# Patient Record
Sex: Female | Born: 1955 | Race: White | Hispanic: No | Marital: Married | Smoking: Former smoker
Health system: Southern US, Community
[De-identification: ages and names within clinical notes are randomized; demographics above are authoritative.]

## PROBLEM LIST (undated history)

## (undated) DIAGNOSIS — N6019 Diffuse cystic mastopathy of unspecified breast: Secondary | ICD-10-CM

## (undated) DIAGNOSIS — K219 Gastro-esophageal reflux disease without esophagitis: Secondary | ICD-10-CM

## (undated) DIAGNOSIS — C4491 Basal cell carcinoma of skin, unspecified: Secondary | ICD-10-CM

## (undated) DIAGNOSIS — R7303 Prediabetes: Secondary | ICD-10-CM

## (undated) HISTORY — PX: BREAST BIOPSY: SHX20

## (undated) HISTORY — PX: SKIN CANCER EXCISION: SHX779

## (undated) HISTORY — PX: TONSILLECTOMY: SUR1361

---

## 2007-01-18 ENCOUNTER — Ambulatory Visit (HOSPITAL_COMMUNITY): Admission: RE | Admit: 2007-01-18 | Discharge: 2007-01-18 | Payer: Self-pay | Admitting: Emergency Medicine

## 2007-11-11 ENCOUNTER — Encounter: Admission: RE | Admit: 2007-11-11 | Discharge: 2007-11-11 | Payer: Self-pay | Admitting: Family Medicine

## 2007-11-22 ENCOUNTER — Encounter: Admission: RE | Admit: 2007-11-22 | Discharge: 2007-11-22 | Payer: Self-pay | Admitting: Family Medicine

## 2008-02-15 ENCOUNTER — Other Ambulatory Visit: Admission: RE | Admit: 2008-02-15 | Discharge: 2008-02-15 | Payer: Self-pay | Admitting: Family Medicine

## 2008-12-19 ENCOUNTER — Encounter: Admission: RE | Admit: 2008-12-19 | Discharge: 2008-12-19 | Payer: Self-pay | Admitting: Family Medicine

## 2009-06-18 ENCOUNTER — Other Ambulatory Visit: Admission: RE | Admit: 2009-06-18 | Discharge: 2009-06-18 | Payer: Self-pay | Admitting: Family Medicine

## 2010-06-20 ENCOUNTER — Other Ambulatory Visit: Admission: RE | Admit: 2010-06-20 | Discharge: 2010-06-20 | Payer: Self-pay | Admitting: Family Medicine

## 2010-09-22 ENCOUNTER — Encounter: Payer: Self-pay | Admitting: Family Medicine

## 2011-01-28 ENCOUNTER — Other Ambulatory Visit: Payer: Self-pay | Admitting: Dermatology

## 2011-12-15 ENCOUNTER — Other Ambulatory Visit: Payer: Self-pay | Admitting: Family Medicine

## 2011-12-15 DIAGNOSIS — Z1231 Encounter for screening mammogram for malignant neoplasm of breast: Secondary | ICD-10-CM

## 2011-12-18 ENCOUNTER — Ambulatory Visit
Admission: RE | Admit: 2011-12-18 | Discharge: 2011-12-18 | Disposition: A | Discharge status: Home / Self Care | Payer: Private Health Insurance - Indemnity | Source: Ambulatory Visit | Attending: Family Medicine | Admitting: Family Medicine

## 2011-12-18 DIAGNOSIS — Z1231 Encounter for screening mammogram for malignant neoplasm of breast: Secondary | ICD-10-CM

## 2012-08-13 ENCOUNTER — Other Ambulatory Visit: Payer: Self-pay

## 2012-11-24 ENCOUNTER — Other Ambulatory Visit: Payer: Self-pay

## 2012-11-24 DIAGNOSIS — Z1231 Encounter for screening mammogram for malignant neoplasm of breast: Secondary | ICD-10-CM

## 2012-12-21 ENCOUNTER — Ambulatory Visit: Payer: Private Health Insurance - Indemnity

## 2012-12-30 ENCOUNTER — Ambulatory Visit
Admission: RE | Admit: 2012-12-30 | Discharge: 2012-12-30 | Disposition: A | Discharge status: Home / Self Care | Payer: Private Health Insurance - Indemnity | Source: Ambulatory Visit

## 2012-12-30 DIAGNOSIS — Z1231 Encounter for screening mammogram for malignant neoplasm of breast: Secondary | ICD-10-CM

## 2013-02-21 ENCOUNTER — Emergency Department (HOSPITAL_COMMUNITY): Payer: Private Health Insurance - Indemnity

## 2013-02-21 ENCOUNTER — Emergency Department (HOSPITAL_COMMUNITY)
Admission: EM | Admit: 2013-02-21 | Discharge: 2013-02-22 | Disposition: A | Discharge status: Home / Self Care | Payer: Private Health Insurance - Indemnity | Attending: Emergency Medicine | Admitting: Emergency Medicine

## 2013-02-21 ENCOUNTER — Encounter (HOSPITAL_COMMUNITY): Payer: Self-pay | Admitting: Vascular Surgery

## 2013-02-21 DIAGNOSIS — R109 Unspecified abdominal pain: Secondary | ICD-10-CM

## 2013-02-21 DIAGNOSIS — Z862 Personal history of diseases of the blood and blood-forming organs and certain disorders involving the immune mechanism: Secondary | ICD-10-CM | POA: Insufficient documentation

## 2013-02-21 DIAGNOSIS — K219 Gastro-esophageal reflux disease without esophagitis: Secondary | ICD-10-CM | POA: Insufficient documentation

## 2013-02-21 DIAGNOSIS — R0789 Other chest pain: Secondary | ICD-10-CM | POA: Insufficient documentation

## 2013-02-21 DIAGNOSIS — R209 Unspecified disturbances of skin sensation: Secondary | ICD-10-CM | POA: Insufficient documentation

## 2013-02-21 DIAGNOSIS — M549 Dorsalgia, unspecified: Secondary | ICD-10-CM | POA: Insufficient documentation

## 2013-02-21 DIAGNOSIS — Z79899 Other long term (current) drug therapy: Secondary | ICD-10-CM | POA: Insufficient documentation

## 2013-02-21 DIAGNOSIS — Z8742 Personal history of other diseases of the female genital tract: Secondary | ICD-10-CM | POA: Insufficient documentation

## 2013-02-21 DIAGNOSIS — Z85828 Personal history of other malignant neoplasm of skin: Secondary | ICD-10-CM | POA: Insufficient documentation

## 2013-02-21 DIAGNOSIS — R110 Nausea: Secondary | ICD-10-CM | POA: Insufficient documentation

## 2013-02-21 DIAGNOSIS — Z8639 Personal history of other endocrine, nutritional and metabolic disease: Secondary | ICD-10-CM | POA: Insufficient documentation

## 2013-02-21 DIAGNOSIS — Z87891 Personal history of nicotine dependence: Secondary | ICD-10-CM | POA: Insufficient documentation

## 2013-02-21 DIAGNOSIS — R1013 Epigastric pain: Secondary | ICD-10-CM | POA: Insufficient documentation

## 2013-02-21 DIAGNOSIS — R000 Tachycardia, unspecified: Secondary | ICD-10-CM | POA: Insufficient documentation

## 2013-02-21 DIAGNOSIS — R079 Chest pain, unspecified: Secondary | ICD-10-CM

## 2013-02-21 DIAGNOSIS — R0602 Shortness of breath: Secondary | ICD-10-CM | POA: Insufficient documentation

## 2013-02-21 HISTORY — DX: Prediabetes: R73.03

## 2013-02-21 HISTORY — DX: Basal cell carcinoma of skin, unspecified: C44.91

## 2013-02-21 HISTORY — DX: Gastro-esophageal reflux disease without esophagitis: K21.9

## 2013-02-21 HISTORY — DX: Diffuse cystic mastopathy of unspecified breast: N60.19

## 2013-02-21 LAB — POCT I-STAT TROPONIN I: Troponin i, poc: 0.01 ng/mL (ref 0.00–0.08)

## 2013-02-21 LAB — CBC WITH DIFFERENTIAL/PLATELET
Basophils Absolute: 0 10*3/uL (ref 0.0–0.1)
Basophils Relative: 0 % (ref 0–1)
Eosinophils Relative: 1 % (ref 0–5)
HCT: 43.6 % (ref 36.0–46.0)
Hemoglobin: 15.2 g/dL — ABNORMAL HIGH (ref 12.0–15.0)
MCH: 30.4 pg (ref 26.0–34.0)
MCHC: 34.9 g/dL (ref 30.0–36.0)
MCV: 87.2 fL (ref 78.0–100.0)
Monocytes Absolute: 0.6 10*3/uL (ref 0.1–1.0)
Monocytes Relative: 4 % (ref 3–12)
Neutro Abs: 12.6 10*3/uL — ABNORMAL HIGH (ref 1.7–7.7)
RDW: 13.4 % (ref 11.5–15.5)

## 2013-02-21 LAB — COMPREHENSIVE METABOLIC PANEL
AST: 13 U/L (ref 0–37)
Albumin: 4.1 g/dL (ref 3.5–5.2)
BUN: 13 mg/dL (ref 6–23)
Calcium: 9.3 mg/dL (ref 8.4–10.5)
Chloride: 102 mEq/L (ref 96–112)
Creatinine, Ser: 0.63 mg/dL (ref 0.50–1.10)
GFR calc non Af Amer: >90 mL/min (ref >90)
Total Bilirubin: 0.6 mg/dL (ref 0.3–1.2)

## 2013-02-21 LAB — LIPASE, BLOOD: Lipase: 20 U/L (ref 11–59)

## 2013-02-21 MED ORDER — METOCLOPRAMIDE HCL 5 MG/ML IJ SOLN
10.0000 mg | Freq: Once | INTRAMUSCULAR | Status: AC
  Administered 2013-02-21: 10 mg via INTRAVENOUS
  Filled 2013-02-21: qty 2

## 2013-02-21 MED ORDER — IOHEXOL 350 MG/ML SOLN: 100.0000 mL | Freq: Once | INTRAVENOUS | Status: AC | PRN

## 2013-02-21 NOTE — ED Provider Notes (Signed)
History    CSN: [id number] Arrival date & time 02/21/13  2039  First MD Initiated Contact with Patient 02/21/13 2138     Chief Complaint  Patient presents with  . Chest Pain   (Consider location/radiation/quality/duration/timing/severity/associated sxs/prior Treatment) Patient is a 57 y.o. female presenting with chest pain. The history is provided by the patient.  Chest Pain Pain location:  Substernal area Pain quality: pressure   Pain radiates to:  Does not radiate Pain radiates to the back: no   Pain severity:  Moderate Onset quality:  Sudden Timing:  Intermittent Progression:  Unchanged Chronicity:  New Context: at rest   Relieved by:  Nothing Worsened by:  Certain positions and deep breathing Ineffective treatments:  None tried Associated symptoms: abdominal pain (prior to had onset of abd pain described at "tightness" that has been intermittent, located over mid abdomen), back pain (sharp, mid back pain), nausea and shortness of breath   Associated symptoms: no cough, no diaphoresis, no dizziness, no fever, no numbness, not vomiting and no weakness    Past Medical History  Diagnosis Date  . Prediabetes   . GERD (gastroesophageal reflux disease)   . Skin cancer, basal cell   . Fibrocystic breast disease in female    Past Surgical History  Procedure Laterality Date  . Tonsillectomy    . Skin cancer excision    . Breast biopsy     No family history on file. History  Substance Use Topics  . Smoking status: Former Games developer  . Smokeless tobacco: Not on file  . Alcohol Use: Yes     Comment: occasionally    OB History   Grav Para Term Preterm Abortions TAB SAB Ect Mult Living                 Review of Systems  Constitutional: Negative for fever, chills and diaphoresis.  HENT: Negative for congestion and rhinorrhea.   Respiratory: Positive for shortness of breath. Negative for cough and chest tightness.   Cardiovascular: Positive for chest pain. Negative for  leg swelling.  Gastrointestinal: Positive for nausea and abdominal pain (prior to had onset of abd pain described at "tightness" that has been intermittent, located over mid abdomen). Negative for vomiting and diarrhea.  Genitourinary: Negative for dysuria.  Musculoskeletal: Positive for back pain (sharp, mid back pain).  Neurological: Negative for dizziness, weakness and numbness.       Tingling of right hand and arm.   All other systems reviewed and are negative.    Allergies  Review of patient's allergies indicates no known allergies.  Home Medications   Current Outpatient Rx  Name  Route  Sig  Dispense  Refill  . ergocalciferol (VITAMIN D2) 50000 UNITS capsule   Oral   Take 50,000 Units by mouth once a week. Take on Sundays         . famotidine (PEPCID) 20 MG tablet   Oral   Take 20 mg by mouth daily as needed for heartburn.          BP 125/91  Pulse 106  Temp(Src) 98.5 F (36.9 C) (Oral)  Resp 18  SpO2 98% Physical Exam  Nursing note and vitals reviewed. Constitutional: She is oriented to person, place, and time. She appears well-developed and well-nourished. No distress.  HENT:  Head: Normocephalic and atraumatic.  Mouth/Throat: Oropharynx is clear and moist.  Eyes: EOM are normal. Pupils are equal, round, and reactive to light.  Neck: Normal range of motion. Neck supple.  Cardiovascular: Normal rate, regular rhythm and normal heart sounds.  Exam reveals no friction rub.   No murmur heard. Pulmonary/Chest: Effort normal and breath sounds normal. No respiratory distress. She has no wheezes. She has no rales.  Abdominal: Soft. There is no tenderness. There is no rebound and no guarding.  Musculoskeletal: Normal range of motion. She exhibits no edema and no tenderness.  Lymphadenopathy:    She has no cervical adenopathy.  Neurological: She is alert and oriented to person, place, and time.  Skin: Skin is warm and dry. No rash noted.  Psychiatric: She has a  normal mood and affect. Her behavior is normal.    ED Course  Procedures (including critical care time) Labs Reviewed  CBC WITH DIFFERENTIAL - Abnormal; Notable for the following:    WBC 14.0 (*)    Hemoglobin 15.2 (*)    Neutrophils Relative % 90 (*)    Neutro Abs 12.6 (*)    Lymphocytes Relative 6 (*)    All other components within normal limits  COMPREHENSIVE METABOLIC PANEL - Abnormal; Notable for the following:    Glucose, Bld 123 (*)    All other components within normal limits  LIPASE, BLOOD  POCT I-STAT TROPONIN I   Dg Chest 2 View  02/21/2013   *RADIOLOGY REPORT*  Clinical Data: Chest pain, epigastric pain.  CHEST - 2 VIEW  Comparison: None.  Findings: Heart and mediastinal contours are within normal limits. No focal opacities or effusions.  No acute bony abnormality. Degenerative spurring anteriorly within the lower thoracic spine.  IMPRESSION: No active cardiopulmonary disease.   Original Report Authenticated By: Charlett Nose, M.D.   Ct Angio Chest Aortic Dissect W &/or W/o  02/22/2013   *RADIOLOGY REPORT*  Clinical Data:  Midsternal pain radiating to back.  Shortness of breath, abdominal pain.  CT ANGIOGRAPHY CHEST, ABDOMEN AND PELVIS  Technique:  Multidetector CT imaging through the chest, abdomen and pelvis was performed using the standard protocol during bolus administration of intravenous contrast.  Multiplanar reconstructed images including MIPs were obtained and reviewed to evaluate the vascular anatomy.  Contrast: OMNIPAQUE IOHEXOL 350 MG/ML SOLN  Comparison:   None.  CTA CHEST  Findings:  Heart is normal size. Aorta is normal caliber.  No evidence of aortic dissection.  No visible coronary artery calcifications. No mediastinal, hilar, or axillary adenopathy. Visualized thyroid and chest wall soft tissues unremarkable.  Small hiatal hernia.  Dependent atelectasis in the lungs.  Otherwise no focal opacities. No pleural effusions.  No acute bony abnormality.  While not  scanned optimally opacified pulmonary arteries, no filling defects are visualized to suggest large or significant pulmonary emboli.   Review of the MIP images confirms the above findings.  IMPRESSION: No evidence of aortic aneurysm or dissection.  No acute bony abnormality.  CTA ABDOMEN AND PELVIS  Findings:  Mild diffuse fatty infiltration of the liver.  No focal lesion.  Gallbladder, spleen, pancreas, adrenals and kidneys are normal.  Aorta and iliac vessels are normal caliber.  No aneurysm or dissection.  Mesenteric vessels and renal arteries are widely patent.  There is a small accessory superior right renal artery.  Small scattered retroperitoneal lymph nodes and mesenteric lymph nodes, none pathologically enlarged.  Uterus, adnexa and urinary bladder are unremarkable.  No acute bony abnormality. Appendix is visualized and is normal.   Review of the MIP images confirms the above findings.  IMPRESSION:  No evidence of aortic aneurysm or dissection.  No acute findings in the abdomen  or pelvis.   Original Report Authenticated By: Charlett Nose, M.D.   Ct Angio Abd/pel W/ And/or W/o  02/22/2013   *RADIOLOGY REPORT*  Clinical Data:  Midsternal pain radiating to back.  Shortness of breath, abdominal pain.  CT ANGIOGRAPHY CHEST, ABDOMEN AND PELVIS  Technique:  Multidetector CT imaging through the chest, abdomen and pelvis was performed using the standard protocol during bolus administration of intravenous contrast.  Multiplanar reconstructed images including MIPs were obtained and reviewed to evaluate the vascular anatomy.  Contrast: OMNIPAQUE IOHEXOL 350 MG/ML SOLN  Comparison:   None.  CTA CHEST  Findings:  Heart is normal size. Aorta is normal caliber.  No evidence of aortic dissection.  No visible coronary artery calcifications. No mediastinal, hilar, or axillary adenopathy. Visualized thyroid and chest wall soft tissues unremarkable.  Small hiatal hernia.  Dependent atelectasis in the lungs.  Otherwise no  focal opacities. No pleural effusions.  No acute bony abnormality.  While not scanned optimally opacified pulmonary arteries, no filling defects are visualized to suggest large or significant pulmonary emboli.   Review of the MIP images confirms the above findings.  IMPRESSION: No evidence of aortic aneurysm or dissection.  No acute bony abnormality.  CTA ABDOMEN AND PELVIS  Findings:  Mild diffuse fatty infiltration of the liver.  No focal lesion.  Gallbladder, spleen, pancreas, adrenals and kidneys are normal.  Aorta and iliac vessels are normal caliber.  No aneurysm or dissection.  Mesenteric vessels and renal arteries are widely patent.  There is a small accessory superior right renal artery.  Small scattered retroperitoneal lymph nodes and mesenteric lymph nodes, none pathologically enlarged.  Uterus, adnexa and urinary bladder are unremarkable.  No acute bony abnormality. Appendix is visualized and is normal.   Review of the MIP images confirms the above findings.  IMPRESSION:  No evidence of aortic aneurysm or dissection.  No acute findings in the abdomen or pelvis.   Original Report Authenticated By: Charlett Nose, M.D.   Date: 02/22/2013  Rate: 95  Rhythm: normal sinus rhythm  QRS Axis: left  Intervals: normal  ST/T Wave abnormalities: normal  Conduction Disutrbances:none  Narrative Interpretation: borderline R wave progression  Old EKG Reviewed: none available   1. Abdominal pain   2. Chest pain   3. Back pain   4. Nausea     MDM  37:14 PM 57 year old female with no major medical problems presenting with sudden onset abdominal pain described as a tightness with later onset of chest pressure and a sharp mid back back pain. It symptoms were associated with shortness of breath and nausea and have been intermittent. Patient is noted to be tachycardic in triage, heart rate in the mid to upper 90s on my exam. She does state she has intermittent tingling of her right arm during these episodes.  Given tachycardia, chest, abdominal and back pain along with tingling in her right hand in a woman with no similar symptoms in the past Will check labs the troponin and CT thoracic dissection protocol.  1:17 AM CT dissection study neg. Nausea improved with reglan. Likely GI source. Story not c/w ACS. No PE seen. She will f/u with PCP for re-eval. Pt understanding of plan and dc'd home in stable condition.  Caren Hazy, MD 02/22/13 [phone]

## 2013-02-21 NOTE — ED Notes (Signed)
Pt reports to the ED for eval of midsternal CP that radiates into her back. Pt was driving home today and began experiencing generalized abdominal pain. Pt got home and when she was lying down she began having severe CP that radiated into her back. Pt also reports SOB, N/V, and abdominal pain. Pt has had 8 mg of Zofran, 324 of ASA, and 1 nitro PTA. Pt refused 2nd dose of nitro due to HA. Pts 12 lead was unremarkable. V/S stable en route. Pt A&O x 4. Pt can speak in full sentences without difficulty. Pt denies any coffee ground or gross blood in her emesis. Pt was sinus tach upon arrival however, she is in NSR at this time. Lying flat aggravates the abdominal pain.

## 2013-02-22 ENCOUNTER — Emergency Department (HOSPITAL_COMMUNITY): Payer: Private Health Insurance - Indemnity

## 2013-02-22 MED ORDER — METOCLOPRAMIDE HCL 10 MG PO TABS: 10.0000 mg | ORAL_TABLET | Freq: Four times a day (QID) | ORAL | Status: DC

## 2013-02-22 MED ORDER — IOHEXOL 350 MG/ML SOLN
100.0000 mL | Freq: Once | INTRAVENOUS | Status: AC | PRN
  Administered 2013-02-22: 100 mL via INTRAVENOUS

## 2013-02-22 NOTE — ED Provider Notes (Signed)
57 year old female developed abdominal pain today which she eventually radiated up to the chest and then into the right side of her back. There has been associated dyspnea and nausea. She has not vomited. On exam, she has epigastric tenderness but no other abdominal tenderness. Lungs are clear and heart has a regular rate and rhythm. Workup has been initiated including CT angiogram to rule out dissecting aneurysm.   Date: 02/22/2013  Rate: 95  Rhythm: normal sinus rhythm  QRS Axis: left  Intervals: normal  ST/T Wave abnormalities: normal  Conduction Disutrbances:left anterior fascicular block  Narrative Interpretation: Left anterior fascicular block, left atrial hypertrophy. No prior ECG available for comparison.  Old EKG Reviewed: none available    I saw and evaluated the patient, reviewed the resident's note and I agree with the findings and plan.  Dione Booze, MD 02/22/13 0030

## 2013-06-08 ENCOUNTER — Other Ambulatory Visit (HOSPITAL_COMMUNITY)
Admission: RE | Admit: 2013-06-08 | Discharge: 2013-06-08 | Disposition: A | Discharge status: Home / Self Care | Payer: Private Health Insurance - Indemnity | Source: Ambulatory Visit | Attending: Family Medicine | Admitting: Family Medicine

## 2013-06-08 ENCOUNTER — Other Ambulatory Visit: Payer: Self-pay | Admitting: Family Medicine

## 2013-06-08 DIAGNOSIS — Z124 Encounter for screening for malignant neoplasm of cervix: Secondary | ICD-10-CM | POA: Insufficient documentation

## 2014-05-25 ENCOUNTER — Other Ambulatory Visit: Payer: Self-pay

## 2014-05-25 DIAGNOSIS — Z1231 Encounter for screening mammogram for malignant neoplasm of breast: Secondary | ICD-10-CM

## 2014-06-12 ENCOUNTER — Ambulatory Visit
Admission: RE | Admit: 2014-06-12 | Discharge: 2014-06-12 | Disposition: A | Discharge status: Home / Self Care | Payer: Managed Care, Other (non HMO) | Source: Ambulatory Visit

## 2014-06-12 DIAGNOSIS — Z1231 Encounter for screening mammogram for malignant neoplasm of breast: Secondary | ICD-10-CM

## 2015-02-14 ENCOUNTER — Ambulatory Visit
Admission: RE | Admit: 2015-02-14 | Discharge: 2015-02-14 | Disposition: A | Discharge status: Home / Self Care | Payer: Managed Care, Other (non HMO) | Source: Ambulatory Visit | Attending: Family Medicine | Admitting: Family Medicine

## 2015-02-14 ENCOUNTER — Other Ambulatory Visit: Payer: Self-pay | Admitting: Family Medicine

## 2015-02-14 DIAGNOSIS — W19XXXA Unspecified fall, initial encounter: Secondary | ICD-10-CM

## 2015-03-22 ENCOUNTER — Ambulatory Visit (INDEPENDENT_AMBULATORY_CARE_PROVIDER_SITE_OTHER): Payer: Managed Care, Other (non HMO) | Admitting: Podiatry

## 2015-03-22 ENCOUNTER — Encounter: Payer: Self-pay | Admitting: Podiatry

## 2015-03-22 VITALS — BP 127/77 | HR 85 | Resp 16 | Ht 61.0 in | Wt 165.0 lb

## 2015-03-22 DIAGNOSIS — L600 Ingrowing nail: Secondary | ICD-10-CM

## 2015-03-22 MED ORDER — NEOMYCIN-POLYMYXIN-HC 3.5-10000-1 OT SOLN: OTIC | Status: AC

## 2015-03-22 NOTE — Patient Instructions (Signed)

## 2015-03-22 NOTE — Progress Notes (Signed)
   Subjective:    Patient ID: Monica Norris, female    DOB: 08-25-1956, 59 y.o.   MRN: [id number]  HPI Comments: "I have an ingrown toenail"  Patient c/o aching 1st toe right, lateral border, for about 1 week. She went to have a pedicure and they trimmed it out some. Red and swollen. She has been trimming some and soaking.  Diabetic - last A1c was 6.1  Toe Pain       Review of Systems  Musculoskeletal: Positive for arthralgias.  Skin: Positive for rash.  Allergic/Immunologic: Positive for environmental allergies.  All other systems reviewed and are negative.      Objective:   Physical Exam  I have reviewed his past medical history medications allergy surgery social history and review of systems. Pulses are strongly palpable. Neurologic sensorium is intact per Semmes-Weinstein monofilament. Deep tendon reflexes are intact bilateral muscle strength +5 over 5 dorsiflexion plantar flexors and inverters everters all intrinsic musculature is intact. Orthopedic evaluation of his traits all joints distal to the ankle range of motion without crepitation. Cutaneous evaluation demonstrates supple well-hydrated cutis sharply incurvated nail margins.         Assessment & Plan:  Assessment: Ingrown nail hallux right  Plan: Chemical matrixectomy was performed today after local anesthesia was achieved. Patient tolerated procedure well as the nail was split from distal to proximal avulsed margins exposed the matrix of the nailbed to the phenol. Phenol was applied 30 seconds each 3 and was neutralized with isopropyl all all. Patient will start soaking it twice a day in Betadine warm water and apply Cortisporin Otic as directed. These instructions were provided and reviewed.

## 2015-04-03 ENCOUNTER — Ambulatory Visit (INDEPENDENT_AMBULATORY_CARE_PROVIDER_SITE_OTHER): Payer: Managed Care, Other (non HMO) | Admitting: Podiatry

## 2015-04-03 ENCOUNTER — Encounter: Payer: Self-pay | Admitting: Podiatry

## 2015-04-03 VITALS — BP 109/62 | HR 59 | Resp 15

## 2015-04-03 DIAGNOSIS — L600 Ingrowing nail: Secondary | ICD-10-CM

## 2015-04-03 NOTE — Progress Notes (Signed)
This patient presents today for follow-up matrixectomy. She continues to soak twice daily and apply Cortisporin otic as directed. Relating no complaints.  Objective: Vital signs are stable. Secondly site appears to be healing well without erythema or drainage purulence or odor.  Assessment: Well-healing surgical matrixectomy without complications.  Plan: Currently we will discontinue the use of Betadine soaks and start with Epsom salts and water twice daily. Continue the use of Cortisporin Otic and covered during the day leaving it open at night time. She will continue to soak the toe until completely well. They will continue to watch the toe for signs and symptoms of infection should any arise we will be notified immediately. Follow-up when necessary.

## 2015-05-15 ENCOUNTER — Other Ambulatory Visit: Payer: Self-pay

## 2015-05-15 DIAGNOSIS — Z1231 Encounter for screening mammogram for malignant neoplasm of breast: Secondary | ICD-10-CM

## 2015-06-20 ENCOUNTER — Ambulatory Visit
Admission: RE | Admit: 2015-06-20 | Discharge: 2015-06-20 | Disposition: A | Discharge status: Home / Self Care | Payer: Managed Care, Other (non HMO) | Source: Ambulatory Visit

## 2015-06-20 DIAGNOSIS — Z1231 Encounter for screening mammogram for malignant neoplasm of breast: Secondary | ICD-10-CM

## 2015-07-20 ENCOUNTER — Other Ambulatory Visit: Payer: Self-pay | Admitting: Family Medicine

## 2015-07-20 DIAGNOSIS — E2839 Other primary ovarian failure: Secondary | ICD-10-CM

## 2015-08-24 ENCOUNTER — Ambulatory Visit
Admission: RE | Admit: 2015-08-24 | Discharge: 2015-08-24 | Disposition: A | Discharge status: Home / Self Care | Payer: Managed Care, Other (non HMO) | Source: Ambulatory Visit | Attending: Family Medicine | Admitting: Family Medicine

## 2015-08-24 DIAGNOSIS — E2839 Other primary ovarian failure: Secondary | ICD-10-CM

## 2016-11-07 IMAGING — CR DG SHOULDER 2+V*L*
3 series · 3 of 3 positions shown · non-contrast
Comparison: None.

CLINICAL DATA: Left shoulder pain for 2 weeks. Fall. Initial
evaluation.

EXAM:
LEFT SHOULDER - 2+ VIEW

[w shoulder grashey left]
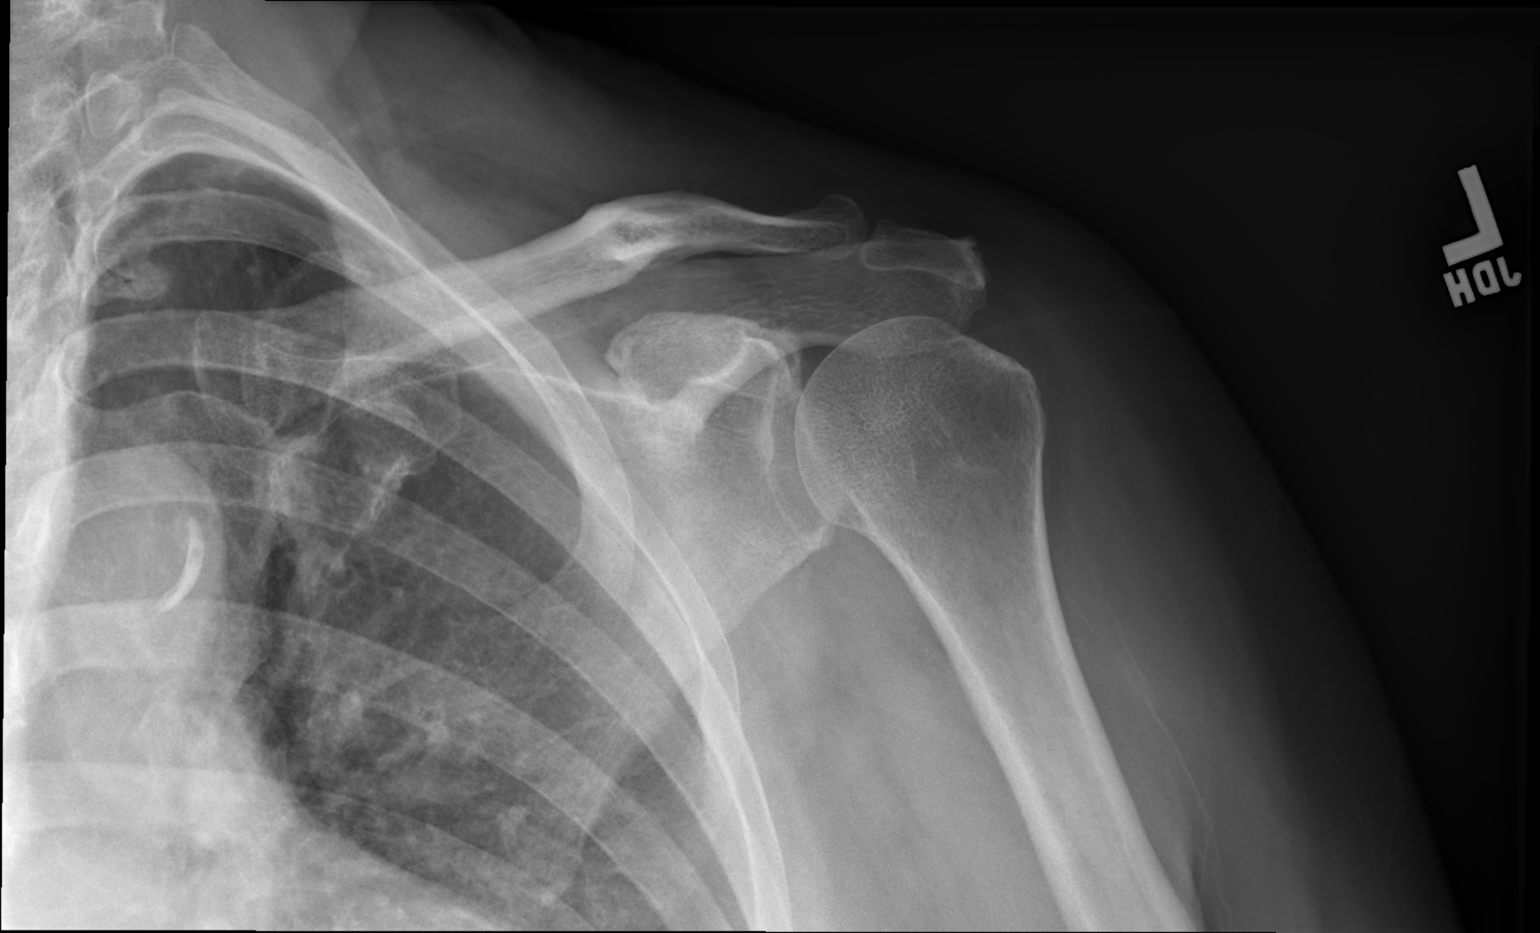

[w shoulder y-view left]
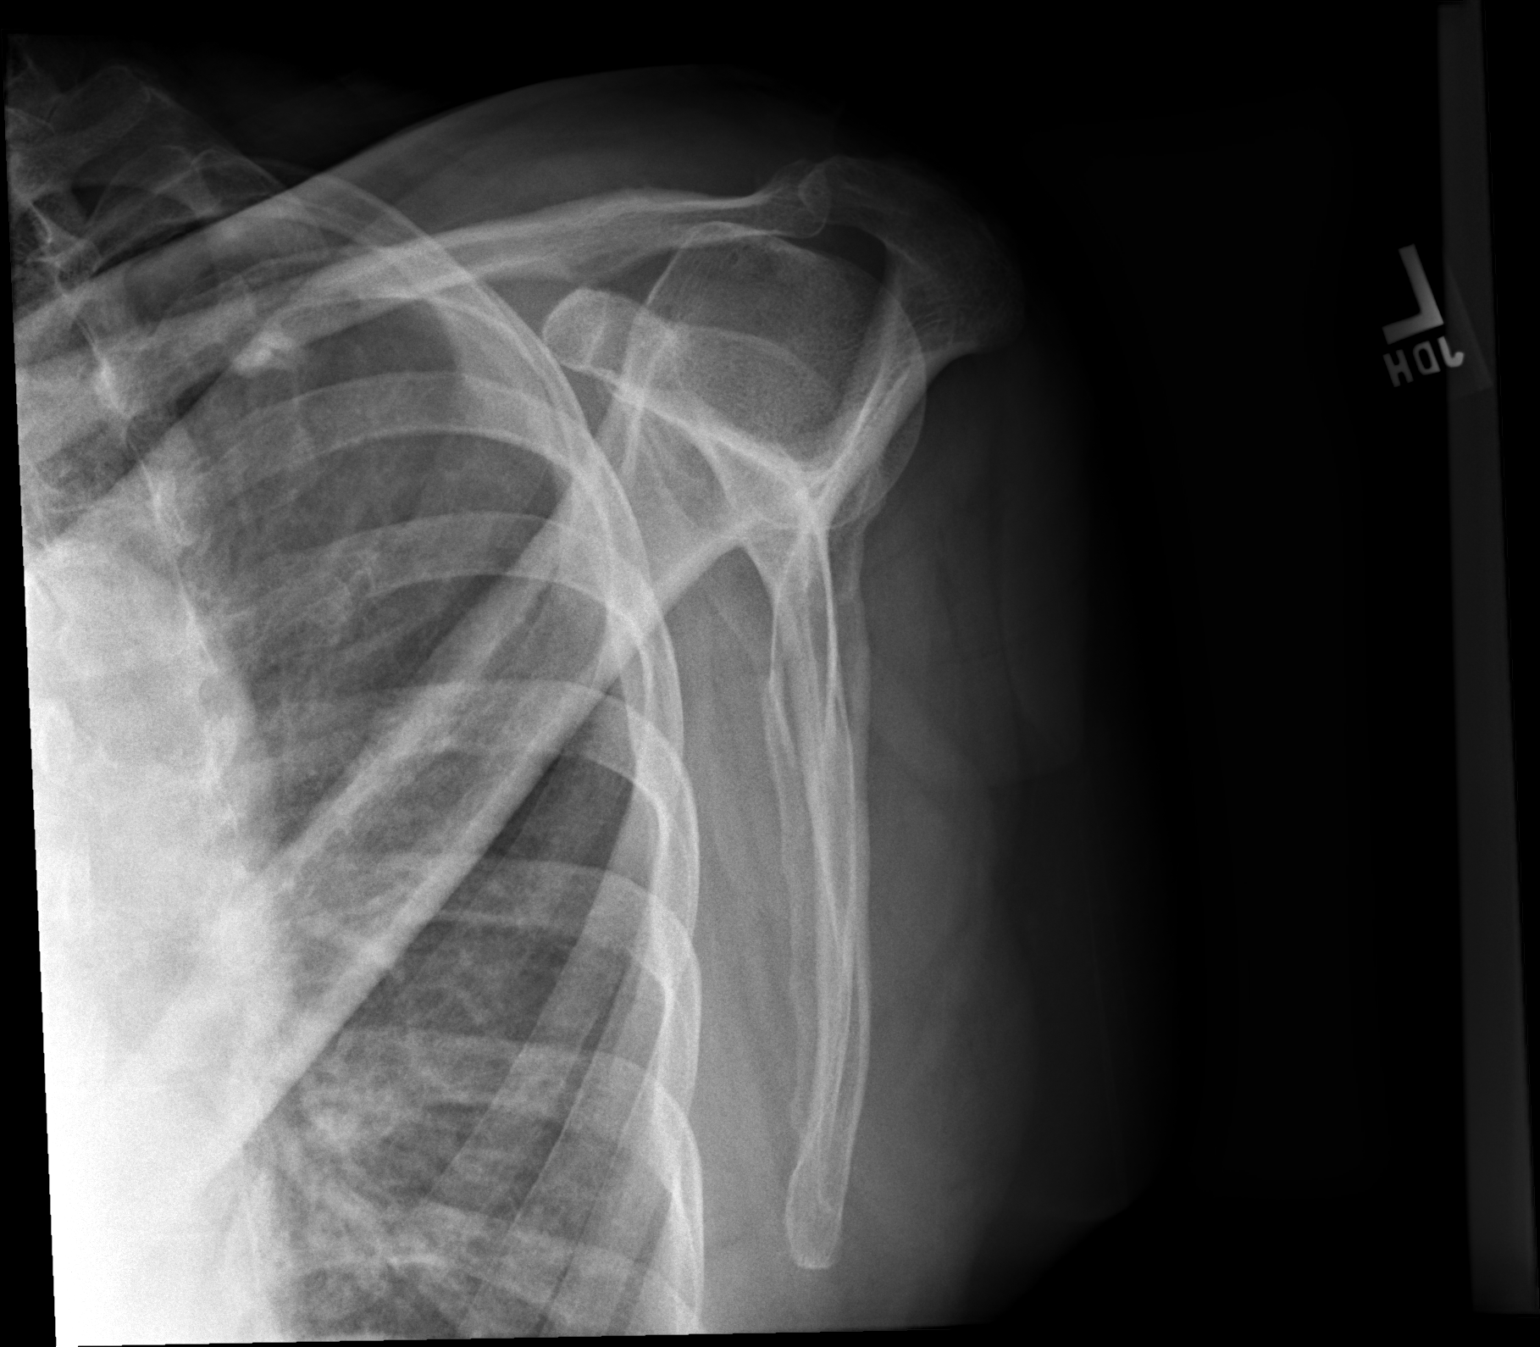

[w shoulder axillary left]
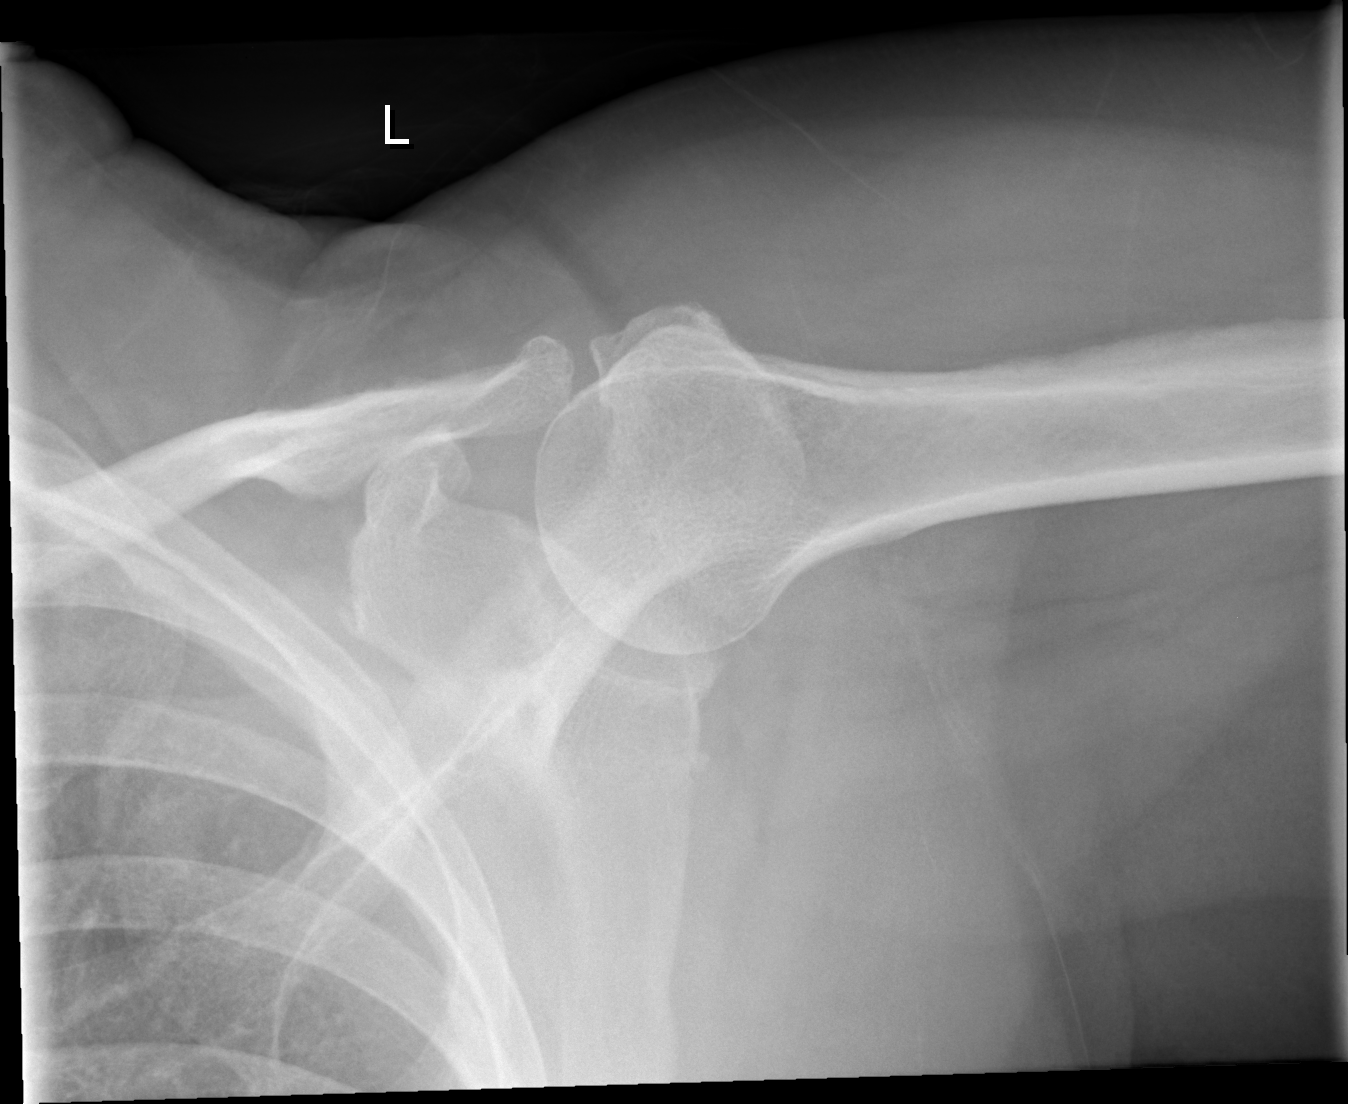

[3 of 3 positions shown; findings below may reference images not displayed]

FINDINGS: Acromioclavicular glenohumeral degenerative change. Tiny loose body
in the axillary pouch cannot be excluded. Cannot be excluded . No
evidence of fracture or dislocation. No evidence of separation.
IMPRESSION: Acromioclavicular glenohumeral degenerative change. Tiny loose body
in the axillary pouch cannot be excluded. No acute abnormality.
